# Patient Record
Sex: Male | Born: 1995 | Race: White | Hispanic: No | Marital: Single | Smoking: Never smoker
Health system: Southern US, Community
[De-identification: ages and names within clinical notes are randomized; demographics above are authoritative.]

## PROBLEM LIST (undated history)

## (undated) HISTORY — PX: TONSILLECTOMY: SUR1361

---
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
## 2017-07-06 ENCOUNTER — Telehealth (INDEPENDENT_AMBULATORY_CARE_PROVIDER_SITE_OTHER): Payer: Self-pay | Admitting: Urology

## 2017-07-07 NOTE — Telephone Encounter (Signed)
If opening you can put him in , but dont double book. Offer to see Belenda Cruise if there is an appt. This is short notice for not-emergent problem so no problem if we can not get him in. Offer he can go see Dr. Chrys Racer or Dr. Kendall Flack

## 2017-07-30 ENCOUNTER — Encounter (INDEPENDENT_AMBULATORY_CARE_PROVIDER_SITE_OTHER): Payer: Self-pay

## 2017-08-04 ENCOUNTER — Encounter (INDEPENDENT_AMBULATORY_CARE_PROVIDER_SITE_OTHER): Payer: Self-pay | Admitting: Urology

## 2017-08-04 ENCOUNTER — Ambulatory Visit (INDEPENDENT_AMBULATORY_CARE_PROVIDER_SITE_OTHER): Payer: No Typology Code available for payment source | Admitting: Urology

## 2017-08-04 VITALS — BP 142/97 | HR 68 | Ht 74.0 in | Wt 156.0 lb

## 2017-08-04 DIAGNOSIS — N451 Epididymitis: Secondary | ICD-10-CM

## 2017-08-04 MED ORDER — DOXYCYCLINE HYCLATE 50 MG PO CAPS
100.0000 mg | ORAL_CAPSULE | Freq: Two times a day (BID) | ORAL | 0 refills | Status: AC
Start: 2017-08-04 — End: 2017-08-11

## 2017-08-04 NOTE — Patient Instructions (Signed)
-   Start doxycycline 100mg  twice daily for 7 days    - Take ibuprofen or other anti-inflammatory for pain

## 2017-08-04 NOTE — Progress Notes (Signed)
Subjective:      Patient ID:  Kerry Patton is a 22 y.o.M referred for urologic evaluation by ER follow up    Chief Complaint:  1. Epididymitis          Reports R testicular pain which started 1 month ago.  He was seen in the ER fr this issue.  UA 11-20 WBCs, 3-5 RBCs.  Testicular US consisted with epididimytis.  He was treated with rocephin and zithromax.  Pain has largely resolved but he has some lingering sensitivity.  No h/o trauma.  No associated urinary complaints.      The following portions of the patient's history were reviewed and updated as appropriate: allergies, current medications, past family history, past medical history, past social history, past surgical history and problem list.    Review of Systems  History obtained from the patient  General ROS: negative for - chills, fatigue, fever or weight loss  Psychological ROS: negative for - depression, disorientation or memory difficulties  Ophthalmic ROS: negative for - blurry vision or loss of vision  Hematological and Lymphatic ROS: negative for - bleeding problems, night sweats or swollen lymph nodes  Endocrine ROS: negative for - malaise/lethargy, polydipsia/polyuria or skin changes  Respiratory ROS: no cough, shortness of breath, or wheezing  Cardiovascular ROS: no chest pain or dyspnea on exertion  Gastrointestinal ROS: no abdominal pain, change in bowel habits, or black or bloody stools  Genito-Urinary ROS: no dysuria, trouble voiding, or hematuria  Musculoskeletal ROS: negative for - joint pain or muscle pain  Neurological ROS: negative for - headaches, impaired coordination/balance or numbness/tingling       Objective:     Vitals:    08/04/17 1534   BP: (!) 142/97   Pulse: 68     General appearance - alert, well appearing, and in no distress  Mental status - alert, oriented to person, place, and time  Chest - non labrd breathing   Heart - normal rate and regular rhythm  Abdomen - soft, nontender, nondistended, no masses or organomegaly  GU  Male - no penile lesions or discharge, no testicular masses or tenderness, no hernias  Musculoskeletal - no joint tenderness, deformity or swelling  Extremities - peripheral pulses normal, no pedal edema, no clubbing or cyanosis      Lab Review   Urine analysis shows unable to leave urine sample today     Radiology Review   Scrotal US as above     Assessment:     L epididymits with some lingering discomfort, normal exam      Plan:     - 1 week doxycycline  - NSAIDs prn pan  - FU 6 weeks     Orders  No orders of the defined types were placed in this encounter.

## 2017-08-26 ENCOUNTER — Ambulatory Visit
Admission: RE | Admit: 2017-08-26 | Discharge: 2017-08-26 | Disposition: A | Discharge status: Home / Self Care | Payer: No Typology Code available for payment source | Source: Ambulatory Visit | Attending: Internal Medicine | Admitting: Internal Medicine

## 2017-08-26 DIAGNOSIS — Z0000 Encounter for general adult medical examination without abnormal findings: Secondary | ICD-10-CM | POA: Insufficient documentation

## 2017-08-26 DIAGNOSIS — R634 Abnormal weight loss: Secondary | ICD-10-CM | POA: Insufficient documentation

## 2017-08-26 LAB — COMPREHENSIVE METABOLIC PANEL
ALT: 20 U/L (ref 0–55)
AST (SGOT): 14 U/L (ref 5–34)
Albumin/Globulin Ratio: 1.7 (ref 0.9–2.2)
Albumin: 4.5 g/dL (ref 3.5–5.0)
Alkaline Phosphatase: 74 U/L (ref 38–106)
BUN: 16 mg/dL (ref 9.0–28.0)
Bilirubin, Total: 1.2 mg/dL (ref 0.2–1.2)
CO2: 23 mEq/L (ref 21–29)
Calcium: 10.1 mg/dL (ref 8.5–10.5)
Chloride: 104 mEq/L (ref 100–111)
Creatinine: 0.9 mg/dL (ref 0.5–1.5)
Globulin: 2.7 g/dL (ref 2.0–3.7)
Glucose: 84 mg/dL (ref 70–100)
Potassium: 4 mEq/L (ref 3.5–5.1)
Protein, Total: 7.2 g/dL (ref 6.0–8.3)
Sodium: 140 mEq/L (ref 136–145)

## 2017-08-26 LAB — URINALYSIS WITH MICROSCOPIC
Bilirubin, UA: NEGATIVE
Blood, UA: NEGATIVE
Glucose, UA: NEGATIVE
Ketones UA: NEGATIVE
Leukocyte Esterase, UA: NEGATIVE
Nitrite, UA: NEGATIVE
Protein, UR: NEGATIVE
Specific Gravity UA: 1.023 (ref 1.001–1.035)
Urine pH: 6 (ref 5.0–8.0)
Urobilinogen, UA: 0.2

## 2017-08-26 LAB — CBC AND DIFFERENTIAL
Absolute NRBC: 0 10*3/uL (ref 0.00–0.00)
Basophils Absolute Automated: 0.06 10*3/uL (ref 0.00–0.08)
Basophils Automated: 0.8 %
Eosinophils Absolute Automated: 0.16 10*3/uL (ref 0.00–0.44)
Eosinophils Automated: 2.1 %
Hematocrit: 45.4 % (ref 37.6–49.6)
Hgb: 15.6 g/dL (ref 12.5–17.1)
Immature Granulocytes Absolute: 0.03 10*3/uL (ref 0.00–0.07)
Immature Granulocytes: 0.4 %
Lymphocytes Absolute Automated: 3.2 10*3/uL (ref 0.42–3.22)
Lymphocytes Automated: 42 %
MCH: 30.8 pg (ref 25.1–33.5)
MCHC: 34.4 g/dL (ref 31.5–35.8)
MCV: 89.5 fL (ref 78.0–96.0)
MPV: 11.8 fL (ref 8.9–12.5)
Monocytes Absolute Automated: 0.78 10*3/uL (ref 0.21–0.85)
Monocytes: 10.2 %
Neutrophils Absolute: 3.39 10*3/uL (ref 1.10–6.33)
Neutrophils: 44.5 %
Nucleated RBC: 0 /100 WBC (ref 0.0–0.0)
Platelets: 228 10*3/uL (ref 142–346)
RBC: 5.07 10*6/uL (ref 4.20–5.90)
RDW: 12 % (ref 11–15)
WBC: 7.62 10*3/uL (ref 3.10–9.50)

## 2017-08-26 LAB — HEMOLYSIS INDEX: Hemolysis Index: 7 (ref 0–18)

## 2017-08-26 LAB — LIPID PANEL
Cholesterol / HDL Ratio: 4.2
Cholesterol: 208 mg/dL — ABNORMAL HIGH (ref 0–199)
HDL: 50 mg/dL (ref 40–9999)
LDL Calculated: 140 mg/dL — ABNORMAL HIGH (ref 0–99)
Triglycerides: 89 mg/dL (ref 34–149)
VLDL Calculated: 18 mg/dL (ref 10–40)

## 2017-08-26 LAB — T3, FREE: T3, Free: 2.95 pg/mL (ref 1.71–3.71)

## 2017-08-26 LAB — GFR: EGFR: >60

## 2017-08-26 LAB — T4, FREE: T4 Free: 0.94 ng/dL (ref 0.70–1.48)

## 2017-08-26 LAB — TSH: TSH: 1.13 u[IU]/mL (ref 0.35–4.94)

## 2017-09-15 ENCOUNTER — Ambulatory Visit (INDEPENDENT_AMBULATORY_CARE_PROVIDER_SITE_OTHER): Payer: No Typology Code available for payment source | Admitting: Urology

## 2018-08-17 ENCOUNTER — Ambulatory Visit
Admission: RE | Admit: 2018-08-17 | Discharge: 2018-08-17 | Disposition: A | Discharge status: Home / Self Care | Payer: No Typology Code available for payment source | Source: Ambulatory Visit | Attending: Internal Medicine | Admitting: Internal Medicine

## 2018-08-17 DIAGNOSIS — R5383 Other fatigue: Secondary | ICD-10-CM | POA: Insufficient documentation

## 2018-08-17 DIAGNOSIS — R5381 Other malaise: Secondary | ICD-10-CM | POA: Insufficient documentation

## 2018-08-17 DIAGNOSIS — T148XXA Other injury of unspecified body region, initial encounter: Secondary | ICD-10-CM | POA: Insufficient documentation

## 2018-08-17 DIAGNOSIS — W57XXXA Bitten or stung by nonvenomous insect and other nonvenomous arthropods, initial encounter: Secondary | ICD-10-CM | POA: Insufficient documentation

## 2018-08-17 LAB — LYME AB, TOTAL,REFLEX TO WESTERN BLOT (IGG & IGM): Lyme AB,Total,Reflx to WB(IGM): 0.1

## 2019-05-27 ENCOUNTER — Encounter (INDEPENDENT_AMBULATORY_CARE_PROVIDER_SITE_OTHER): Payer: Self-pay

## 2019-06-01 ENCOUNTER — Ambulatory Visit (INDEPENDENT_AMBULATORY_CARE_PROVIDER_SITE_OTHER): Payer: No Typology Code available for payment source

## 2019-06-01 DIAGNOSIS — Z23 Encounter for immunization: Secondary | ICD-10-CM

## 2019-06-22 ENCOUNTER — Encounter (INDEPENDENT_AMBULATORY_CARE_PROVIDER_SITE_OTHER): Payer: Self-pay

## 2019-06-27 ENCOUNTER — Ambulatory Visit (INDEPENDENT_AMBULATORY_CARE_PROVIDER_SITE_OTHER): Payer: No Typology Code available for payment source

## 2019-06-27 DIAGNOSIS — Z23 Encounter for immunization: Secondary | ICD-10-CM
